# Patient Record
Sex: Male | Born: 1935
Health system: Southern US, Community
[De-identification: ages and names within clinical notes are randomized; demographics above are authoritative.]

## PROBLEM LIST (undated history)

## (undated) DIAGNOSIS — I10 Essential (primary) hypertension: Secondary | ICD-10-CM

---

## 2021-03-11 ENCOUNTER — Emergency Department: Payer: Self-pay

## 2021-03-11 ENCOUNTER — Emergency Department
Admission: EM | Admit: 2021-03-11 | Discharge: 2021-03-11 | Disposition: A | Discharge status: Home / Self Care | Payer: Self-pay | Attending: Emergency Medicine | Admitting: Emergency Medicine

## 2021-03-11 ENCOUNTER — Encounter: Payer: Self-pay | Admitting: Emergency Medicine

## 2021-03-11 DIAGNOSIS — U071 COVID-19: Secondary | ICD-10-CM | POA: Insufficient documentation

## 2021-03-11 DIAGNOSIS — Z5321 Procedure and treatment not carried out due to patient leaving prior to being seen by health care provider: Secondary | ICD-10-CM | POA: Insufficient documentation

## 2021-03-11 HISTORY — DX: Essential (primary) hypertension: I10

## 2021-03-11 LAB — BASIC METABOLIC PANEL
Anion gap: 7 (ref 5–15)
BUN: 16 mg/dL (ref 8–23)
CO2: 23 mmol/L (ref 22–32)
Calcium: 8.4 mg/dL — ABNORMAL LOW (ref 8.9–10.3)
Chloride: 91 mmol/L — ABNORMAL LOW (ref 98–111)
Creatinine, Ser: 1.18 mg/dL (ref 0.61–1.24)
GFR, Estimated: >60 mL/min (ref >60)
Glucose, Bld: 217 mg/dL — ABNORMAL HIGH (ref 70–99)
Potassium: 3.9 mmol/L (ref 3.5–5.1)
Sodium: 121 mmol/L — ABNORMAL LOW (ref 135–145)

## 2021-03-11 LAB — CBC
HCT: 36.9 % — ABNORMAL LOW (ref 39.0–52.0)
Hemoglobin: 13.1 g/dL (ref 13.0–17.0)
MCH: 29.7 pg (ref 26.0–34.0)
MCHC: 35.5 g/dL (ref 30.0–36.0)
MCV: 83.7 fL (ref 80.0–100.0)
Platelets: 102 10*3/uL — ABNORMAL LOW (ref 150–400)
RBC: 4.41 MIL/uL (ref 4.22–5.81)
RDW: 12.9 % (ref 11.5–15.5)
WBC: 7.1 10*3/uL (ref 4.0–10.5)
nRBC: 0 % (ref 0.0–0.2)

## 2021-03-11 LAB — TROPONIN I (HIGH SENSITIVITY): Troponin I (High Sensitivity): 18 ng/L — ABNORMAL HIGH (ref <18)

## 2021-03-11 NOTE — ED Triage Notes (Signed)
Pt with son in triage room due to language barrier. Pt with cough x3 days, worse today as well as increased weakness today. Home COVID test, positive today.

## 2022-09-14 IMAGING — CR DG CHEST 2V
2 series · 2 of 2 positions shown · non-contrast
Comparison: None.

CLINICAL DATA: Shortness of breath.  COVID positive.

EXAM:
CHEST - 2 VIEW

[chest pa]
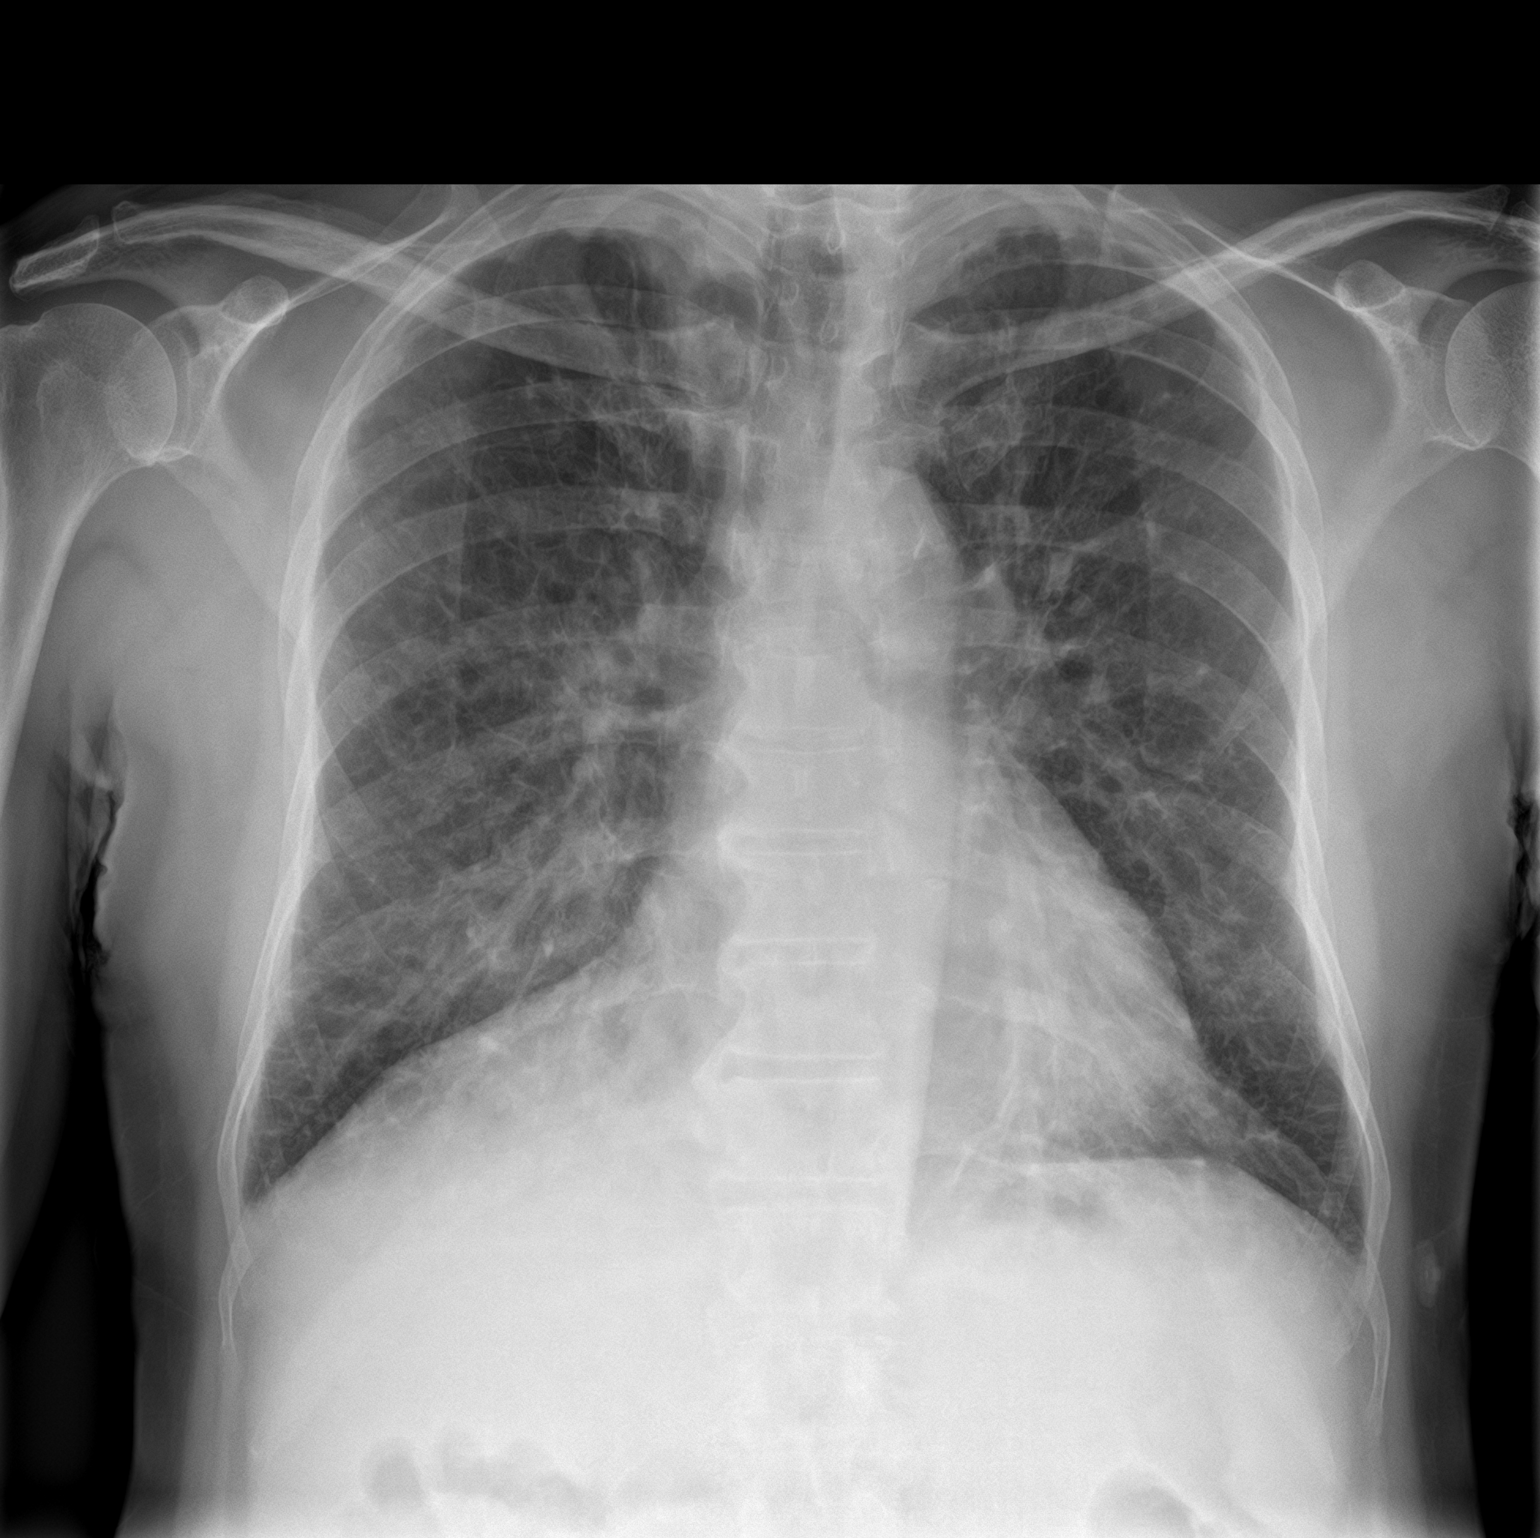

[chest lat]
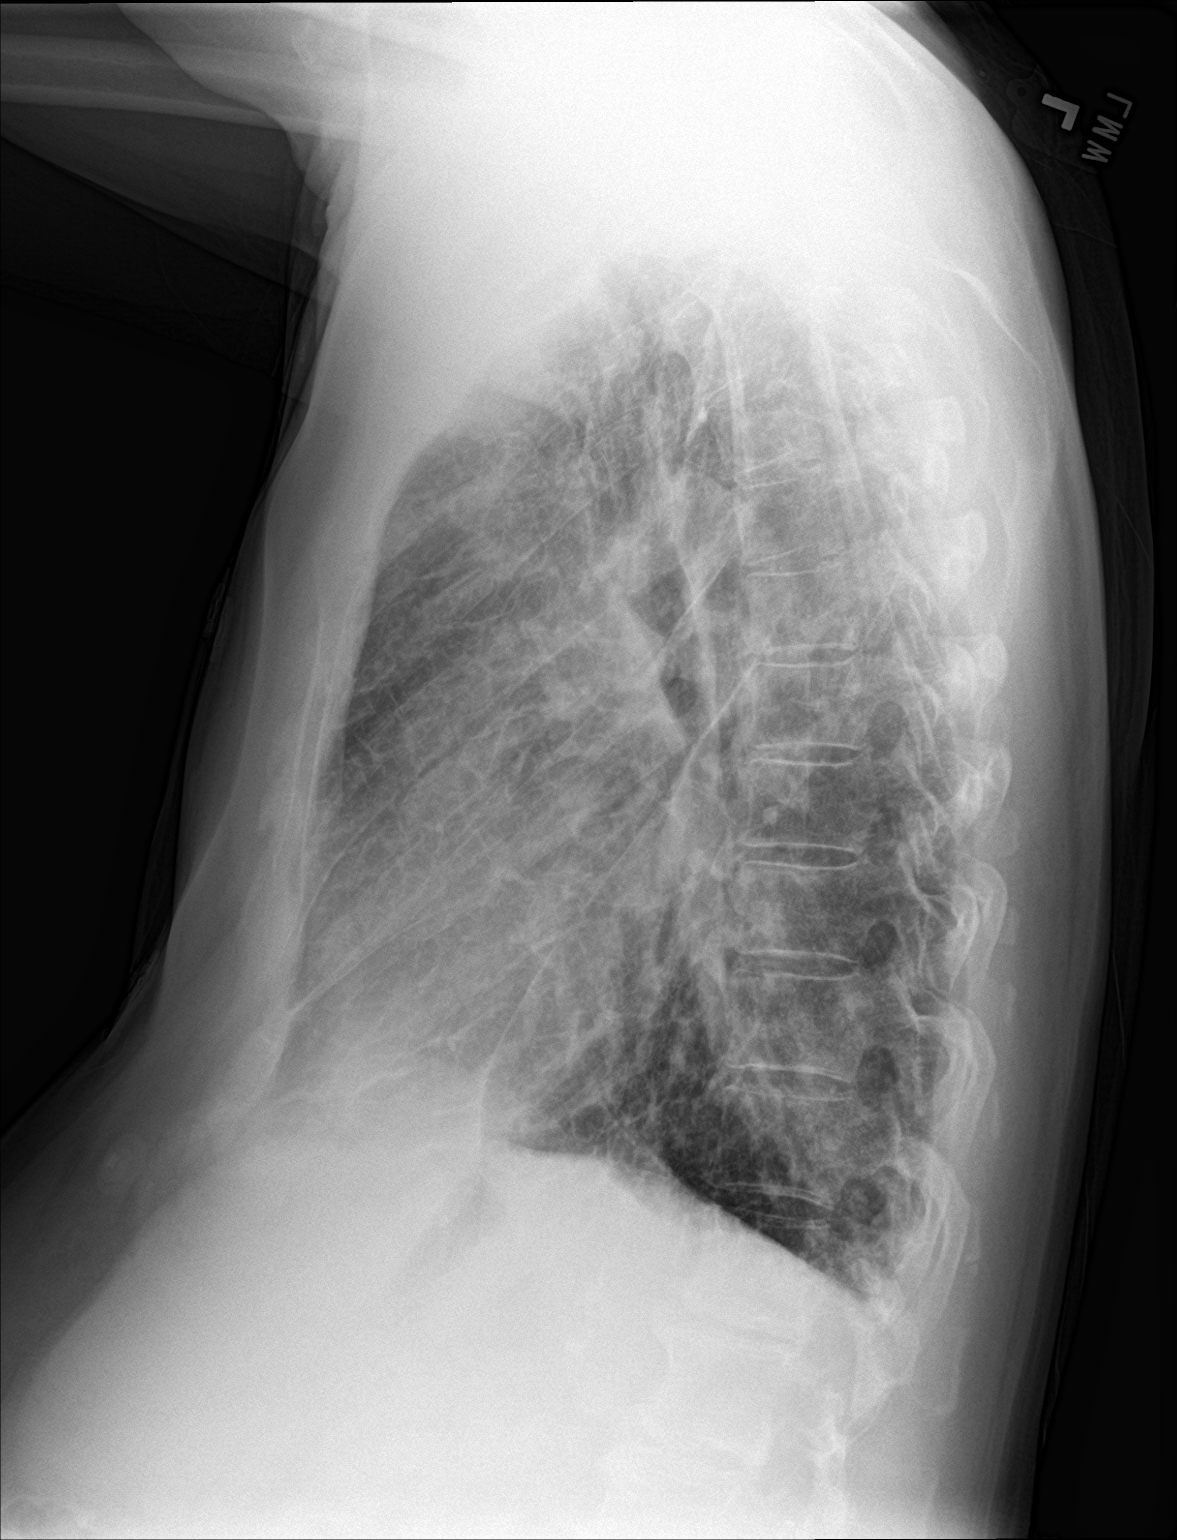

[2 of 2 positions shown; findings below may reference images not displayed]

FINDINGS: Heart size and pulmonary vascularity are normal. Bronchial
thickening and coarse central interstitial changes with mild basilar
peripheral interstitial changes. In the setting of COVID positive,
this may represent multifocal pneumonia. No pleural effusions. No
pneumothorax. Mediastinal contours appear intact. Calcification of
the aorta.
IMPRESSION: Interstitial changes as discussed with bronchial thickening. In the
setting of COVID positive, this likely represents multifocal
pneumonia.
